# Patient Record
Sex: Male | Born: 2005 | Race: White | Hispanic: No | Marital: Single | Smoking: Never smoker
Health system: Southern US, Community
[De-identification: ages and names within clinical notes are randomized; demographics above are authoritative.]

## PROBLEM LIST (undated history)

## (undated) DIAGNOSIS — L309 Dermatitis, unspecified: Secondary | ICD-10-CM

## (undated) DIAGNOSIS — R5600 Simple febrile convulsions: Secondary | ICD-10-CM

## (undated) DIAGNOSIS — J45909 Unspecified asthma, uncomplicated: Secondary | ICD-10-CM

## (undated) DIAGNOSIS — R062 Wheezing: Secondary | ICD-10-CM

## (undated) HISTORY — DX: Unspecified asthma, uncomplicated: J45.909

## (undated) HISTORY — DX: Dermatitis, unspecified: L30.9

---
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
## 2024-01-21 ENCOUNTER — Emergency Department (HOSPITAL_COMMUNITY)

## 2024-01-21 ENCOUNTER — Encounter (HOSPITAL_COMMUNITY): Payer: Self-pay | Admitting: *Deleted

## 2024-01-21 ENCOUNTER — Emergency Department (HOSPITAL_COMMUNITY)
Admission: EM | Admit: 2024-01-21 | Discharge: 2024-01-21 | Disposition: A | Discharge status: Home / Self Care | Attending: Emergency Medicine | Admitting: Emergency Medicine

## 2024-01-21 DIAGNOSIS — Z7952 Long term (current) use of systemic steroids: Secondary | ICD-10-CM | POA: Insufficient documentation

## 2024-01-21 DIAGNOSIS — J45909 Unspecified asthma, uncomplicated: Secondary | ICD-10-CM | POA: Insufficient documentation

## 2024-01-21 DIAGNOSIS — R000 Tachycardia, unspecified: Secondary | ICD-10-CM | POA: Insufficient documentation

## 2024-01-21 DIAGNOSIS — Z7951 Long term (current) use of inhaled steroids: Secondary | ICD-10-CM | POA: Insufficient documentation

## 2024-01-21 DIAGNOSIS — R509 Fever, unspecified: Secondary | ICD-10-CM | POA: Insufficient documentation

## 2024-01-21 DIAGNOSIS — Z0389 Encounter for observation for other suspected diseases and conditions ruled out: Secondary | ICD-10-CM | POA: Diagnosis not present

## 2024-01-21 LAB — CBC
HCT: 45 % (ref 39.0–52.0)
Hemoglobin: 15.6 g/dL (ref 13.0–17.0)
MCH: 30.2 pg (ref 26.0–34.0)
MCHC: 34.7 g/dL (ref 30.0–36.0)
MCV: 87 fL (ref 80.0–100.0)
Platelets: 176 K/uL (ref 150–400)
RBC: 5.17 MIL/uL (ref 4.22–5.81)
RDW: 11.9 % (ref 11.5–15.5)
WBC: 7.4 K/uL (ref 4.0–10.5)
nRBC: 0 % (ref 0.0–0.2)

## 2024-01-21 LAB — COMPREHENSIVE METABOLIC PANEL WITH GFR
ALT: 19 U/L (ref 0–44)
AST: 29 U/L (ref 15–41)
Albumin: 4.6 g/dL (ref 3.5–5.0)
Alkaline Phosphatase: 111 U/L (ref 38–126)
Anion gap: 11 (ref 5–15)
BUN: 21 mg/dL — ABNORMAL HIGH (ref 6–20)
CO2: 26 mmol/L (ref 22–32)
Calcium: 9.8 mg/dL (ref 8.9–10.3)
Chloride: 97 mmol/L — ABNORMAL LOW (ref 98–111)
Creatinine, Ser: 1.1 mg/dL (ref 0.61–1.24)
GFR, Estimated: >60 mL/min (ref >60)
Glucose, Bld: 140 mg/dL — ABNORMAL HIGH (ref 70–99)
Potassium: 4.1 mmol/L (ref 3.5–5.1)
Sodium: 134 mmol/L — ABNORMAL LOW (ref 135–145)
Total Bilirubin: 0.7 mg/dL (ref 0.0–1.2)
Total Protein: 7.8 g/dL (ref 6.5–8.1)

## 2024-01-21 LAB — CK: Total CK: 225 U/L (ref 49–397)

## 2024-01-21 LAB — URINALYSIS, W/ REFLEX TO CULTURE (INFECTION SUSPECTED)
Bacteria, UA: NONE SEEN
Bilirubin Urine: NEGATIVE
Glucose, UA: NEGATIVE mg/dL
Hgb urine dipstick: NEGATIVE
Ketones, ur: NEGATIVE mg/dL
Leukocytes,Ua: NEGATIVE
Nitrite: NEGATIVE
Protein, ur: NEGATIVE mg/dL
Specific Gravity, Urine: 1.006 (ref 1.005–1.030)
pH: 7 (ref 5.0–8.0)

## 2024-01-21 LAB — LIPASE, BLOOD: Lipase: 26 U/L (ref 11–51)

## 2024-01-21 LAB — PROTIME-INR
INR: 1 (ref 0.8–1.2)
Prothrombin Time: 14.3 s (ref 11.4–15.2)

## 2024-01-21 LAB — I-STAT CG4 LACTIC ACID, ED
Lactic Acid, Venous: 2.2 mmol/L (ref 0.5–1.9)
Lactic Acid, Venous: 1 mmol/L (ref 0.5–1.9)

## 2024-01-21 LAB — RESP PANEL BY RT-PCR (RSV, FLU A&B, COVID)  RVPGX2
Influenza A by PCR: NEGATIVE
Influenza B by PCR: NEGATIVE
Resp Syncytial Virus by PCR: NEGATIVE
SARS Coronavirus 2 by RT PCR: NEGATIVE

## 2024-01-21 MED ORDER — LACTATED RINGERS IV BOLUS: 1000.0000 mL | Freq: Once | INTRAVENOUS | Status: DC

## 2024-01-21 MED ORDER — KETOROLAC TROMETHAMINE 15 MG/ML IJ SOLN
15.0000 mg | Freq: Once | INTRAMUSCULAR | Status: AC
  Administered 2024-01-21: 15 mg via INTRAVENOUS
  Filled 2024-01-21: qty 1

## 2024-01-21 MED ORDER — CYCLOBENZAPRINE HCL 10 MG PO TABS: 10.0000 mg | ORAL_TABLET | Freq: Two times a day (BID) | ORAL | 0 refills | Status: AC | PRN

## 2024-01-21 MED ORDER — CYCLOBENZAPRINE HCL 10 MG PO TABS
5.0000 mg | ORAL_TABLET | Freq: Once | ORAL | Status: AC
  Administered 2024-01-21: 5 mg via ORAL
  Filled 2024-01-21: qty 1

## 2024-01-21 MED ORDER — LACTATED RINGERS IV BOLUS
1000.0000 mL | Freq: Once | INTRAVENOUS | Status: AC
  Administered 2024-01-21: 1000 mL via INTRAVENOUS

## 2024-01-21 MED ORDER — ACETAMINOPHEN 325 MG PO TABS
650.0000 mg | ORAL_TABLET | Freq: Once | ORAL | Status: AC | PRN
  Administered 2024-01-21: 650 mg via ORAL
  Filled 2024-01-21: qty 2

## 2024-01-21 NOTE — Discharge Instructions (Addendum)
 Jerry Russo  Thank you for allowing us  to take care of you today.  You came to the Emergency Department today because you are having back pain, muscle aches, stuffiness, and fever after a recent intense workout at the gym.  Your sickle exam was reassuring, you do not have any signs or symptoms of spinal cord compression on history such as bowel or bladder incontinence, or numbness around your anus.  You do not have any signs on exam of having encephalitis or meningitis.  In the emergency department we checked multiple labs.  Your CK was negative, therefore you are not in rhabdomyolysis.  Your pancreas numbers normal.  Your liver and kidney numbers are normal.  Your blood salts are reassuring.  Your blood counts do not show a elevation of your white blood cell count which would be a marker of inflammation/infection, and your red blood cells are normal.  Your urine does not show signs of urinary tract infection.  You are negative for COVID, flu, RSV which are the most common viruses.  Did initially have some mild elevation of your acid number, which commonly happens with fevers, however after getting up at a fluid it came down appropriately.  Overall, it is unclear what precisely caused your symptoms, most likely a virus given your fever, muscle aches, and congestion, though the answer is not fully clear.  Importantly we have ruled out the emergency causes of symptoms, and your physical exam is reassuring, therefore you are safe to follow-up closely outpatient with your PCP.  You can continue to use Tylenol  and ibuprofen as needed for your back pain.  Will prescribe you a short course of muscle relaxers that you can use for breakthrough pain.  Do not drive after using this medication as it make you sleepy.  We will give you a referral to sports medicine given you feel that you have increased pain after intense workouts.  To-Do: 1. Please follow-up with your primary doctor within 1 - 2 weeks / as soon  as possible.   Please return to the Emergency Department or call 911 if you experience have worsening of your symptoms, or do not get better, chest pain, shortness of breath, severe or significantly worsening pain, high fever, severe confusion, pass out or have any reason to think that you need emergency medical care.   We hope you feel better soon.   Mitzie Later, MD Department of Emergency Medicine [facility]

## 2024-01-21 NOTE — ED Triage Notes (Signed)
 Pt arrived with EMS for back pain initially. Felt that he was just sore from increased working out; reports that he wasn't feeling great earlier tonight. Took a THC gummy as he has before; then started having NV and generalized bodyaches. Noted to be febrile at 103

## 2024-01-21 NOTE — ED Provider Notes (Signed)
 [place] EMERGENCY DEPARTMENT AT [facility] Provider Note   CSN: [id number] Arrival date & time: 01/21/24  0204     History No chief complaint on file.   HPI: Jerry Russo is a 18 y.o. male with history pertinent allergies, asthma, eczema who presents complaining of multiple symptoms. Patient arrived via POV accompanied by mother.  History provided by patient and parent.  No interpreter required during this encounter.  Patient reports that he was in his normal state of health earlier this week.  Patient reports that on 12/11 or 12/12 he was at the gym and trying to maximize his weight lifting, and had a particularly intense workout.  Reports that since then he has had diffuse myalgias, particularly back pain.  Denies any bowel or bladder incontinence or retention, saddle anesthesia, history of malignancy, history of IVDU.  Reports that he is up-to-date on his vaccinations per patient and mother.  Reports that today in particular he began feeling general malaise and worsening muscle aches, as well as some congestion and rhinorrhea.  Denies any cough, sore throat, chest pain, abdominal pain.  Reports that he thought that he was developing a viral illness, however notably no one else at home has been sick, though patient did just returned from being away at college within the past 24 hours.  He took a THC gummy to help him sleep, and approximately 4 hours later developed several episodes of nausea/vomiting, which has since resolved.  Mother expresses concern that patient works out too much, states that he works out 5 days a week, and reports that previously he has had similar presentations where he has flulike symptoms after intense workouts.  States that she has concern for abdomen lysis, though patient has not previously been diagnosed with this condition.  Patient's recorded medical, surgical, social, medication list and allergies were reviewed in the Snapshot window as part of  the initial history.   Prior to Admission medications  Medication Sig Start Date End Date Taking? Authorizing Provider  cyclobenzaprine  (FLEXERIL ) 10 MG tablet Take 1 tablet (10 mg total) by mouth 2 (two) times daily as needed for muscle spasms. 01/21/24  Yes Rogelia Jerilynn RAMAN, MD  azelastine  (ASTELIN ) 0.1 % nasal spray 2 sprays in each nostril 1-2 times daily as needed 01/12/21   Jeneal Danita Macintosh, MD  cetirizine  (ZYRTEC ) 10 MG tablet Take 1 tablet (10 mg total) by mouth daily. 01/12/21   Jeneal Danita Macintosh, MD  EPIPEN  2-PAK 0.3 MG/0.3ML SOAJ injection Inject 0.3 mg into the muscle as needed for anaphylaxis. 07/19/21   Jeneal Danita Macintosh, MD  famotidine  (PEPCID ) 20 MG tablet Take 1 tablet (20 mg total) by mouth 2 (two) times daily. Take 1 tablet Thursday morning, and 1 tablet in the evening than take 1 tablet Friday morning and 1 tablet in the evening for RUSH. 07/19/21   Jeneal Danita Macintosh, MD  fluticasone  (FLONASE ) 50 MCG/ACT nasal spray 2 sprays in each nostril daily for 1-2 weeks at a time 01/12/21   Jeneal Danita Macintosh, MD  ipratropium (ATROVENT ) 0.06 % nasal spray 2 sprays each nostril twice a day 06/22/21   Jeneal Danita Macintosh, MD  montelukast  (SINGULAIR ) 10 MG tablet Take 1 tablet (10 mg total) by mouth at bedtime. Take 1 tabs (10 mg) Thursday morning and 1 tab Friday morning for RUSH. 07/19/21   Jeneal Danita Macintosh, MD  predniSONE  (DELTASONE ) 20 MG tablet Take 1 tablet (20 mg total) by mouth daily with breakfast. Take 2 tablets Thursday  morning and 2 tablets on Friday Morning for RUSH 07/19/21   Jeneal Danita Macintosh, MD  VENTOLIN  HFA 108 [phone] Base) MCG/ACT inhaler 2 puffs every 4-6 hours as needed 06/22/21   Jeneal Danita Macintosh, MD     Allergies: Patient has no known allergies.   Review of Systems   ROS as per HPI  Physical Exam Updated Vital Signs BP (!) 118/52 (BP Location: Right Arm)   Pulse 90   Temp 98.4 F (36.9 C) (Oral)    Resp 18   SpO2 97%  Physical Exam Vitals and nursing note reviewed.  Constitutional:      General: He is not in acute distress.    Appearance: He is well-developed.  HENT:     Head: Normocephalic and atraumatic.     Mouth/Throat:     Mouth: Mucous membranes are moist.     Pharynx: Oropharynx is clear.  Eyes:     Extraocular Movements: Extraocular movements intact.     Conjunctiva/sclera: Conjunctivae normal.  Neck:     Meningeal: Brudzinski's sign and Kernig's sign absent.  Cardiovascular:     Rate and Rhythm: Regular rhythm. Tachycardia present.     Heart sounds: No murmur heard. Pulmonary:     Effort: Pulmonary effort is normal. No respiratory distress.     Breath sounds: Normal breath sounds.  Abdominal:     Palpations: Abdomen is soft.     Tenderness: There is no abdominal tenderness.  Musculoskeletal:        General: No swelling.     Cervical back: Neck supple. No bony tenderness. No pain with movement.     Thoracic back: No bony tenderness.     Lumbar back: No bony tenderness.  Skin:    General: Skin is warm and dry.     Capillary Refill: Capillary refill takes less than 2 seconds.  Neurological:     General: No focal deficit present.     Mental Status: He is alert and oriented to person, place, and time.     Cranial Nerves: No cranial nerve deficit.     Sensory: No sensory deficit.     Motor: No weakness.  Psychiatric:        Mood and Affect: Mood normal.     ED Course/ Medical Decision Making/ A&P    Procedures Procedures   Medications Ordered in ED Medications  acetaminophen  (TYLENOL ) tablet 650 mg (650 mg Oral Given 01/21/24 0215)  lactated ringers  bolus 1,000 mL (0 mLs Intravenous Stopped 01/21/24 0743)  ketorolac  (TORADOL ) 15 MG/ML injection 15 mg (15 mg Intravenous Given 01/21/24 0531)  cyclobenzaprine  (FLEXERIL ) tablet 5 mg (5 mg Oral Given 01/21/24 0531)    Medical Decision Making:   Jerry Russo is a 18 y.o. male who presents for multiple  symptoms as per above.  Physical exam is pertinent for fever, tachycardia.   The differential includes but is not limited to sepsis, rhabdomyolysis, dehydration, viral illness, cauda equina syndrome, fracture, dislocation, epidural abscess, meningitis, encephalitis.  Independent historian: Parent  External data reviewed: No pertinent external data  Initial Plan:  Screening labs including CBC and Metabolic panel to evaluate for infectious or metabolic etiology of disease.  Screening lactic acid, PT/INR, coags to evaluate for sepsis given abnormal vitals on arrival Urinalysis with reflex culture ordered to evaluate for UTI or relevant urologic/nephrologic pathology.  CK to evaluate for rhabdomyolysis COVID/flu/RSV to evaluate for viral illness given patient febrile Screening lipase given patient had nausea/vomiting prior to arrival Chest x-ray to evaluate  for structural/infectious intra-thoracic pathology.  EKG to evaluate for cardiac pathology Objective evaluation as below reviewed   Labs: Ordered, Independent interpretation, and Details: CBC without leukocytosis, anemia, thrombocytopenia.  CK WNL. CMP without AKI, emergent derangement, emergent LFT abnormality, mild nonspecific hypochloremia, hyponatremia, no priors available for comparison in chart.  Coags reassuring.  UA without UTI or myoglobinuria.  COVID/flu/RSV negative.  Lipase WNL.  Lactic acid very mildly elevated at 2.2, delta WNL at 1  Radiology: Ordered, Independent interpretation, Details: Chest x-ray without focal airspace opacification, cardiomediastinal silhouette derangement, pneumothorax, pleural effusion, bony derangement, and All images reviewed independently.  Agree with radiology report at this time.   DG Chest Port 1 View Result Date: 01/21/2024 EXAM: 1 VIEW(S) XRAY OF THE CHEST 01/21/2024 02:37:28 AM COMPARISON: 01/16/2012 CLINICAL HISTORY: Questionable sepsis FINDINGS: LUNGS AND PLEURA: No focal pulmonary opacity.  No pleural effusion. No pneumothorax. HEART AND MEDIASTINUM: No acute abnormality of the cardiac and mediastinal silhouettes. BONES AND SOFT TISSUES: No acute osseous abnormality. IMPRESSION: 1. No acute cardiopulmonary process identified. Electronically signed by: Oneil Devonshire MD 01/21/2024 02:50 AM EST RP Workstation: [id number]    EKG/Medicine tests: Ordered and Independent interpretation EKG Interpretation: Sinus tachycardia RSR' in V1 or V2, probably normal variant No previous ECGs available Baseline wander Confirmed by Rogelia Satterfield ([id number]) on 01/21/2024 2:26:54 AM  Interventions: Tylenol , LR bolus  See the EMR for full details regarding lab and imaging results.  Patient presents, initially tachycardic and febrile, complains of diffuse muscle pain in the setting of recent workout as well as URI symptoms.  Mother expresses concern that patient has previously had similar presentations after prior intense workouts.  Given vital sign changes, screening labs are indicated, however given patient otherwise well-appearing, is otherwise young, healthy, tachycardia likely directly correlated to febrile status, therefore do not feel that patient requires empiric antibiotics initially.  Patient does not have any recent trauma or falls, does not have any bony tenderness, therefore doubt fracture, dislocation.  Patient has no symptoms concerning for cauda equina syndrome.  While patient does have fever, patient does not have symptoms of spinal compression, does not have focal tenderness to palpation or percussion of midline spine, no history of IVDU, therefore doubt epidural abscess.  Patient with negative Kernig's and Brudzinski's, alert, oriented, therefore doubt meningitis, encephalitis.  Labs were obtained and are reassuring, no evidence of UTI, no evidence of pneumonia on chest x-ray.  CK WNL, doubt rhabdomyolysis.  Lipase WNL, doubt pancreatitis.  Coags reassuring.  Patient without leukocytosis,  therefore severe inflammatory/infectious etiology is unlikely.  Patient is negative for COVID, flu, RSV.  While patient did have initial mild lactic acid elevation, this could very likely be related to initial significant fever of 103, after defervescence and 1 L fluid bolus, patient had clearance of lactic acid, and normalization of heart rate.  Patient continues to be well-appearing on reevaluation.  Mother reports that patient has had intermittent back pain for a month, particularly after workouts.  Again discussed that in the absence of trauma, bony point tenderness, symptoms concerning for epidural abscess, cauda equina syndrome, feel that it is likely related to patient's physical exertion.  Will give patient referral to sports medicine.  Patient was provided a dose of Toradol  and Flexeril  in the ED for musculoskeletal pain as well as prescription for short course of Flexeril  outpatient.  Overall, given patient's reassuring labs, exam, feel that patient is stable for discharge and outpatient management, strict return precautions given, discharged in stable condition, mother and patient  comfortable with this plan.  Presentation is most consistent with acute complicated illness and Current presentation is complicated by underlying chronic conditions  Discussion of management or test interpretations with external provider(s): Not indicated  Risk Drugs:OTC drugs and Prescription drug management  Disposition: DISCHARGE: I believe that the patient is safe for discharge home with outpatient follow-up. Patient was informed of all pertinent physical exam, laboratory, and imaging findings. Patient's suspected etiology of their symptom presentation was discussed with the patient and all questions were answered. We discussed following up with ECP, sports medicine. I provided thorough ED return precautions. The patient feels safe and comfortable with this plan.  MDM generated using voice dictation software and  may contain dictation errors.  Please contact me for any clarification or with any questions.  Clinical Impression:  1. Fever, unspecified fever cause      Discharge   Final Clinical Impression(s) / ED Diagnoses Final diagnoses:  Fever, unspecified fever cause    Rx / DC Orders ED Discharge Orders          Ordered    cyclobenzaprine  (FLEXERIL ) 10 MG tablet  2 times daily PRN        01/21/24 0723    AMB referral to sports medicine        01/21/24 0724             Rogelia Jerilynn RAMAN, MD 01/21/24 [phone]

## 2024-01-26 LAB — CULTURE, BLOOD (ROUTINE X 2)
Culture: NO GROWTH
Culture: NO GROWTH
